# Patient Record
Sex: Male | Born: 1985
Health system: Southern US, Community
[De-identification: ages and names within clinical notes are randomized; demographics above are authoritative.]

---
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
## 2006-11-07 ENCOUNTER — Emergency Department (HOSPITAL_COMMUNITY): Admission: EM | Admit: 2006-11-07 | Discharge: 2006-11-07 | Payer: Self-pay | Admitting: Emergency Medicine

## 2008-10-09 ENCOUNTER — Emergency Department (HOSPITAL_COMMUNITY): Admission: EM | Admit: 2008-10-09 | Discharge: 2008-10-09 | Payer: Self-pay | Admitting: Emergency Medicine

## 2009-09-09 ENCOUNTER — Emergency Department (HOSPITAL_COMMUNITY): Admission: EM | Admit: 2009-09-09 | Discharge: 2009-09-10 | Payer: Self-pay | Admitting: Emergency Medicine

## 2010-07-13 NOTE — Op Note (Signed)
   NAME:  Carl Shepard, Carl Shepard                        ACCOUNT NO.:  [id number]   MEDICAL RECORD NO.:  [id number]                   PATIENT TYPE:  AMB   LOCATION:  DAY                                  FACILITY:  [facility]   PHYSICIAN:  Ronald Shepard. Darrelyn Hillock, M.D.             DATE OF BIRTH:  1985-03-12   DATE OF PROCEDURE:  10/14/2002  DATE OF DISCHARGE:                                 OPERATIVE REPORT   SURGEON:  Georges Lynch. Darrelyn Hillock, M.D.   ASSISTANT:  Nurse.   PREOPERATIVE DIAGNOSIS:  Bucket-handle tear of the posterior horn of the  medial meniscus, right knee, severe.   POSTOPERATIVE DIAGNOSIS:  Bucket-handle tear of the posterior horn of the  medial meniscus, right knee, severe.   OPERATION:  1. Diagnostic arthroscopy, right knee.  2. Medial meniscectomy posterior horn medial meniscus, right knee.   DESCRIPTION OF PROCEDURE:  Under general anesthesia, Shepard routine orthopedic  prep draping of the right knee was carried out.  At this time, three small  punctate incisions were made, one in the suprapatellar area, one in the  lateral joint, one in the medial joint.  I then inserted the cannula and  inflated the knee with fluid in the usual fashion.  I then entered the  arthroscope in lateral approach, did Shepard complete diagnostic arthroscopy.  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  I introduced Shepard shaver suction device and did Shepard partial  medial meniscectomy, thoroughly irrigated out the area, and then removed all  the fluid, closed all three punctate incisions with 3-0 nylon suture.  I  injected 20 mL of 0.5% Marcaine with epinephrine into the knee joint, and Shepard  sterile Neosporin dressing was applied.  The patient left the operating room  in satisfactory condition, postop will be on Bufferin 1 b.i.d. as an  anticoagulant for two weeks.  He will be on Percocet  10/650 for pain and  Robaxin 500 mg t.i.d. for muscle relaxation.  He will be on crutches, full  weightbearing.  I will see him in 12-14 days or prior to that if there is Shepard  problem.                                               Ronald Shepard. Darrelyn Hillock, M.D.    RAG/MEDQ  D:  10/14/2002  T:  10/14/2002  Job:  [id number]

## 2010-07-13 NOTE — Op Note (Signed)
NAME:  Carl Shepard, Carl Shepard                        ACCOUNT NO.:  [id number]   MEDICAL RECORD NO.:  [id number]                   PATIENT TYPE:  OIB   LOCATION:  2856                                 FACILITY:  [facility]   PHYSICIAN:  Dionne Ano. Everlene Other, M.D.         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   POSTOPERATIVE DIAGNOSIS:  Chronic right small finger dislocation secondary  to an injury in November of 2005, now presenting with chronic dislocation,  loss of motion, arthrofibrosis, and volar plate disruption.   PROCEDURE:  1. Open relocation right small finger, chronic dislocation about the     proximal interphalangeal joint.  2. Right small finger volar plate reconstruction.  3. Right small finger capsulotomy, extensor tenolysis, and collateral     ligament release about the accessory and proper collateral ligaments.  4. Manipulation under anesthesia right small finger.  5. Stress radiography right small finger and hand.  6. External fixation with 0.045 K-wire right small finger.   SURGEON:  Dionne Ano. Amanda Pea, M.D.   ASSISTANT:  Karie Chimera, P.Shepard.-C.   COMPLICATIONS:  None.   ANESTHESIA:  General.   TOURNIQUET TIME:  Less than an hour.   INDICATIONS FOR PROCEDURE:  The patient is an 25 year old male who presents  with the above mentioned diagnoses.  After counseling regarding the risks  and benefits of surgery including risks of infection, bleeding, anesthesia,  damage to normal structures, and failure of the surgery to  accomplish___________.  I have discussed with him high propensity toward  degenerative change, possible inability to relocate the finger and  need for  fusion in the future, and possible adverse outcome including neurovascular  injury.  With all issues in mind, he desires to proceed.  All questions have  been encouraged and answered preoperatively.   DESCRIPTION OF PROCEDURE:  The patient was seen by myself in anesthesia.  He  was taken to the operating room and underwent smooth induction of general  anesthesia.  Once this was done, he was placed supine, appropriately padded,  and prepped and draped in the usual sterile fashion about the right upper  extremity.  Once the sterile field was secured, body parts were  appropriately padded and preoperative Ancef was given.  The patient then  underwent Shepard manipulation under anesthesia revealing the chronic dislocation  without any tendency toward relocation successfully.  This was due to the  chronic state of affairs about the finger and the interposed volar plate and  disruption.  I have discussed these issues with him preoperatively and he  understood the likely need for open reduction attempts, etc.  So did his  mother.  The operation commenced with Shepard Chevron approach volarly to the right small  finger and Shepard 250 mm tourniquet control.  I incised the skin.  Following  this, the radial and ulnar neurovascular bundles were protected and swept  out of harm's way.  I identified the A3 pulley and then incised this.  The  A3 pulley was then lifted in an ulnar to radial direction exposing the FEP  and FES tendons.  I then gently mobilized these tendons, placed Shepard Penrose  drain around this, and once this was done, performed Shepard capsulotomy as well  as an extensor tenolysis and collateral ligament release with the [facility]  blade.  The accessory and proper collateral ligaments were released and  extensor tenolysis was performed with Shepard combination of Therapist, nutritional, Theme park manager, and general manipulation.  Shepard beaver blade was used to  remove the volar plate and interposed capsule  from the joint.  Remnants were  saved.  I cleaned the joint out meticulously.  Following this, the patient  had the ability to perform an open relocation of the small finger.  This was  relocated with general stressing and following this, I manipulated the right  small finger under anesthesia.  The patient was manipulated to break up the  last bit of dorsal adhesions and was able to be brought into full flexion.  I was pleased with the flexion and extension.  The patient could be extended  to 15 degrees successfully.  Passed 10 degrees, he had Shepard slight dislocating  feature.  Given the release that was required to relocate him.  Thus, at  this point in time, he underwent open relocation and Shepard right small finger  capsulotomy with extensor tenolysis and collateral ligament release.  The  patient also underwent the manipulation under anesthesia of the right small  finger, taking the small finger to the extremes of flexion and then beyond  somewhat under fluoroscopy providing the ability to see the joint reduced  and without complicating features.  Once this was done, I then performed Shepard  stress radiography revealing the measurements previously outlined and  knowing that the patient had Shepard slight dislocation tendency at the last 10  degrees, I then placed external fixation in the form of Shepard 0.045 K-wire about  the proximal phalanx.  This was driven with Shepard wire driver into the head of  the proximal phalanx to provide Shepard stop to the last 15 degrees of flexion and  thus we can initiate early active flexion of the finger through the FEP and  MPS tendons.  At this point in time, I then performed Shepard tenodesis effect of  the FEP and FDS tendons revealing Shepard full flexion of the finger.  Full  flexion was noted and all looked quite well.   Thus, the patient underwent open relocation of his finger with Shepard  capsulotomy, extensor tenolysis, as well as manipulation under anesthesia, and external fixation.  Once  this was done, I then repaired the volar plate  with Ethibond suture.  The volar plate was reapproximated, but not in Shepard  tight fashion.  Once this was done, we then repaired the A3 pulley with  Ethibond suture of the 4-0 variety.  Following this, the tourniquet was  deflated, Marcaine was placed in the wound for postoperative analgesia, and  the patient then underwent copious irrigation as we performed at multiple  points during the procedure.  Following this, the wound was closed with  Prolene.  The patient had the pin protected with Xeroform and Shepard pin cap and  following this, the patient underwent application of Shepard dual splint.  He  tolerated the procedure well.  There were no complicating features with his  surgery.  We will initiate early range of motion Monday in our office.  We  will discharge him on Keflex, Percocet, and Robaxin.  I have asked him to  notify me with any problems, questions, or concerns.  Otherwise, I will see  him back in the office on Monday to begin early range of motion.  All  questions have been encouraged and answered.  There were no complicating  features with the orthopedic portion of this procedure.                                               Dionne Ano. Everlene Other, M.D.    Nash Mantis  D:  02/25/2003  T:  02/25/2003  Job:  [id number]

## 2010-12-07 LAB — ETHANOL: Alcohol, Ethyl (B): 184 — ABNORMAL HIGH

## 2011-12-29 ENCOUNTER — Emergency Department (HOSPITAL_COMMUNITY)
Admission: EM | Admit: 2011-12-29 | Discharge: 2011-12-29 | Disposition: A | Discharge status: Home / Self Care | Payer: Self-pay | Attending: Emergency Medicine | Admitting: Emergency Medicine

## 2011-12-29 ENCOUNTER — Encounter (HOSPITAL_COMMUNITY): Payer: Self-pay | Admitting: Emergency Medicine

## 2011-12-29 DIAGNOSIS — F17200 Nicotine dependence, unspecified, uncomplicated: Secondary | ICD-10-CM | POA: Insufficient documentation

## 2011-12-29 DIAGNOSIS — M549 Dorsalgia, unspecified: Secondary | ICD-10-CM | POA: Insufficient documentation

## 2011-12-29 MED ORDER — HYDROCODONE-ACETAMINOPHEN 5-325 MG PO TABS: 1.0000 | ORAL_TABLET | ORAL | Status: DC | PRN

## 2011-12-29 MED ORDER — CYCLOBENZAPRINE HCL 10 MG PO TABS: 10.0000 mg | ORAL_TABLET | Freq: Three times a day (TID) | ORAL | Status: DC | PRN

## 2011-12-29 MED ORDER — NAPROXEN 500 MG PO TABS: 500.0000 mg | ORAL_TABLET | Freq: Two times a day (BID) | ORAL | Status: DC

## 2011-12-29 NOTE — ED Provider Notes (Signed)
History     CSN: [id number]  Arrival date & time 12/29/11  1056   First MD Initiated Contact with Patient 12/29/11 1203      Chief Complaint  Patient presents with  . Back Pain    (Consider location/radiation/quality/duration/timing/severity/associated sxs/prior treatment) Patient is a 26 y.o. male presenting with back pain. The history is provided by the patient.  Back Pain  This is a recurrent problem. The current episode started more than 1 week ago. The problem occurs constantly. The problem has not changed since onset.The pain is associated with no known injury. Pertinent negatives include no fever, no numbness, no abdominal pain, no bowel incontinence and no bladder incontinence. Associated symptoms comments: Pain in the right lower back that is the same pain he has had in the past. It radiates into the right leg posteriorly. No numbness, tingling or weakness. .    No past medical history on file.  No past surgical history on file.  No family history on file.  History  Substance Use Topics  . Smoking status: Current Every Day Smoker -- 0.5 packs/day    Types: Cigarettes  . Smokeless tobacco: Not on file  . Alcohol Use: No      Review of Systems  Constitutional: Negative for fever.  HENT: Negative for neck pain.   Gastrointestinal: Negative for abdominal pain and bowel incontinence.  Genitourinary: Negative for bladder incontinence and difficulty urinating.  Musculoskeletal: Positive for back pain.  Neurological: Negative for numbness.    Allergies  Review of patient's allergies indicates no known allergies.  Home Medications   Current Outpatient Rx  Name  Route  Sig  Dispense  Refill  . TIGER BALM EXTRA STRENGTH 11-10 % EX OINT   Apply externally   Apply 1 application topically daily as needed. Apply to painful areas on lower back for pain         . IBUPROFEN 200 MG PO TABS   Oral   Take 200 mg by mouth every 6 (six) hours as needed. For pain           . ADULT MULTIVITAMIN W/MINERALS CH   Oral   Take 1 tablet by mouth daily.           BP 132/79  Pulse 96  Temp 97.7 F (36.5 C) (Oral)  Resp 18  SpO2 100%  Physical Exam  Constitutional: He is oriented to person, place, and time. He appears well-developed and well-nourished. No distress.  Neck: Normal range of motion.  Pulmonary/Chest: Effort normal.  Abdominal: Soft. He exhibits no mass. There is no tenderness.  Musculoskeletal: Normal range of motion.       Right paralumbar tenderness without swelling, discoloration. No sciatic tenderness on right. Distal pulses 2+.  Neurological: He is alert and oriented to person, place, and time. He has normal reflexes. No sensory deficit.  Skin: Skin is warm and dry.  Psychiatric: He has a normal mood and affect.    ED Course  Procedures (including critical care time)  Labs Reviewed - No data to display No results found.   No diagnosis found.  1. Muscular back pain  MDM  Recurrent pain that is better with rest and worse with movement. No neurologic deficits.         Rodena Medin, PA-C 12/29/11 1218

## 2011-12-29 NOTE — ED Notes (Signed)
Pt states that he has had low back pain for a week.  States he has tried to stretch it out, hot bath, ice, tigerbomb, naproxen, and ibuprofen with no relief.

## 2011-12-30 NOTE — ED Provider Notes (Signed)
Medical screening examination/treatment/procedure(s) were performed by non-physician practitioner and as supervising physician I was immediately available for consultation/collaboration.   Suzi Roots, MD 12/30/11 [phone]

## 2015-06-06 ENCOUNTER — Telehealth: Payer: Self-pay | Admitting: *Deleted

## 2015-06-15 NOTE — Telephone Encounter (Signed)
Problems resolved.

## 2015-11-13 ENCOUNTER — Emergency Department (HOSPITAL_COMMUNITY)
Admission: EM | Admit: 2015-11-13 | Discharge: 2015-11-13 | Disposition: A | Discharge status: Home / Self Care | Payer: Self-pay | Attending: Emergency Medicine | Admitting: Emergency Medicine

## 2015-11-13 ENCOUNTER — Encounter (HOSPITAL_COMMUNITY): Payer: Self-pay | Admitting: Emergency Medicine

## 2015-11-13 DIAGNOSIS — H7292 Unspecified perforation of tympanic membrane, left ear: Secondary | ICD-10-CM

## 2015-11-13 DIAGNOSIS — Z87891 Personal history of nicotine dependence: Secondary | ICD-10-CM | POA: Insufficient documentation

## 2015-11-13 MED ORDER — CIPROFLOXACIN-DEXAMETHASONE 0.3-0.1 % OT SUSP: 4.0000 [drp] | Freq: Two times a day (BID) | OTIC | 0 refills | Status: AC

## 2015-11-13 MED FILL — CIPRODEX OTIC SUSPENSION: 0.3-0.1 | 25 days supply | Qty: 8 | Fill #0
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
## 2015-11-13 NOTE — ED Notes (Signed)
Bed: WA21 Expected date:  Expected time:  Means of arrival:  Comments: 

## 2015-11-13 NOTE — Discharge Instructions (Signed)
Use eardrops as prescribed. Avoid placing anything else in your ear dunking your head under water. Follow up with ENT if your symptoms do not improve. Return to the ED if you experience severe worsening of your symptoms, fevers, chills, decreased hearing, worsening pain, drainage from the ear.

## 2015-11-13 NOTE — ED Notes (Signed)
Pt aware of HTN.  Has no insurance for another 30days.  Has plan to see PCP then.  RN counseled on low salt diet and exercise.  [facility] and gave contact info.

## 2017-01-20 ENCOUNTER — Ambulatory Visit (HOSPITAL_COMMUNITY)
Admission: EM | Admit: 2017-01-20 | Discharge: 2017-01-20 | Disposition: A | Discharge status: Home / Self Care | Payer: BLUE CROSS/BLUE SHIELD | Attending: Physician Assistant | Admitting: Physician Assistant

## 2017-01-20 ENCOUNTER — Encounter (HOSPITAL_COMMUNITY): Payer: Self-pay | Admitting: Emergency Medicine

## 2017-01-20 DIAGNOSIS — M5441 Lumbago with sciatica, right side: Secondary | ICD-10-CM | POA: Diagnosis not present

## 2017-01-20 MED ORDER — HYDROCODONE-ACETAMINOPHEN 5-325 MG PO TABS: 1.0000 | ORAL_TABLET | ORAL | 0 refills | Status: DC | PRN

## 2017-01-20 MED ORDER — METHOCARBAMOL 500 MG PO TABS: 500.0000 mg | ORAL_TABLET | Freq: Four times a day (QID) | ORAL | 0 refills | Status: DC | PRN

## 2017-01-20 MED ORDER — PREDNISONE 10 MG (21) PO TBPK: ORAL_TABLET | Freq: Every day | ORAL | 0 refills | Status: DC

## 2017-01-20 NOTE — ED Provider Notes (Signed)
[facility]    CSN: [id number] Arrival date & time: 01/20/17  1208     History   Chief Complaint Chief Complaint  Patient presents with  . Back Pain    HPI Erlene QuanJeffrey A Picco is a 31 y.o. male.   31 year-old male, presenting today complaining of back pain. He states that he helped lift a 300 lb tv on Saturday. Since that time, he has had right sided back pain with right sided sciatica  He denies loss of bowel or bladder function, saddle anesthesia, numbness or weakness in the lower extremities     The history is provided by the patient.  Back Pain  Location:  Lumbar spine Quality:  Aching Radiates to:  R posterior upper leg Pain severity:  Moderate Pain is:  Same all the time Onset quality:  Gradual Duration:  3 days Timing:  Constant Progression:  Unchanged Chronicity:  Recurrent Context: lifting heavy objects   Context: not emotional stress, not falling, not jumping from heights, not MCA, not MVA, not occupational injury and not pedestrian accident   Relieved by:  Nothing Worsened by:  Ambulation and twisting Ineffective treatments:  None tried Associated symptoms: leg pain   Associated symptoms: no abdominal pain, no abdominal swelling, no bladder incontinence, no bowel incontinence, no chest pain, no dysuria, no fever, no headaches, no numbness, no paresthesias, no pelvic pain, no perianal numbness, no tingling, no weakness and no weight loss   Risk factors: no hx of cancer, no hx of osteoporosis, no lack of exercise, no menopause, not obese and not pregnant     History reviewed. No pertinent past medical history.  There are no active problems to display for this patient.   History reviewed. No pertinent surgical history.     Home Medications    Prior to Admission medications   Medication Sig Start Date End Date Taking? Authorizing Provider  ciprofloxacin-dexamethasone (CIPRODEX) otic suspension Place 4 drops into the left ear 2 (two) times  daily. Take for 10 days 11/13/15   Dowless, Lester KinsmanSamantha Tripp, PA-C  HYDROcodone-acetaminophen (NORCO/VICODIN) 5-325 MG tablet Take 1 tablet by mouth every 4 (four) hours as needed. 01/20/17   Diva Lemberger C, PA-C  methocarbamol (ROBAXIN) 500 MG tablet Take 1 tablet (500 mg total) by mouth every 6 (six) hours as needed for muscle spasms. 01/20/17   Deionna Marcantonio C, PA-C  predniSONE (STERAPRED UNI-PAK 21 TAB) 10 MG (21) TBPK tablet Take by mouth daily. Take 6 tabs by mouth daily  for 2 days, then 5 tabs for 2 days, then 4 tabs for 2 days, then 3 tabs for 2 days, 2 tabs for 2 days, then 1 tab by mouth daily for 2 days 01/20/17   Alecia LemmingBlue, Alletta Mattos C, PA-C    Family History History reviewed. No pertinent family history.  Social History Social History   Tobacco Use  . Smoking status: Former Smoker    Packs/day: 0.50    Types: Cigarettes  . Smokeless tobacco: Never Used  Substance Use Topics  . Alcohol use: Yes  . Drug use: No     Allergies   Patient has no known allergies.   Review of Systems Review of Systems  Constitutional: Negative for chills, fever and weight loss.  HENT: Negative for ear pain and sore throat.   Eyes: Negative for pain and visual disturbance.  Respiratory: Negative for cough and shortness of breath.   Cardiovascular: Negative for chest pain and palpitations.  Gastrointestinal: Negative for abdominal pain, bowel  incontinence and vomiting.  Genitourinary: Negative for bladder incontinence, dysuria, hematuria and pelvic pain.  Musculoskeletal: Positive for back pain. Negative for arthralgias.  Skin: Negative for color change and rash.  Neurological: Negative for tingling, seizures, syncope, weakness, numbness, headaches and paresthesias.  All other systems reviewed and are negative.    Physical Exam Triage Vital Signs ED Triage Vitals  Enc Vitals Group     BP 01/20/17 1222 (!) 187/120     Pulse Rate 01/20/17 1222 99     Resp 01/20/17 1222 18     Temp 01/20/17  1222 98.8 F (37.1 C)     Temp Source 01/20/17 1222 Oral     SpO2 01/20/17 1222 100 %     Weight --      Height --      Head Circumference --      Peak Flow --      Pain Score 01/20/17 1223 8     Pain Loc --      Pain Edu? --      Excl. in GC? --    No data found.  Updated Vital Signs BP (!) 187/120   Pulse 99   Temp 98.8 F (37.1 C) (Oral)   Resp 18   SpO2 100%   Visual Acuity Right Eye Distance:   Left Eye Distance:   Bilateral Distance:    Right Eye Near:   Left Eye Near:    Bilateral Near:     Physical Exam  Constitutional: He is oriented to person, place, and time. He appears well-developed and well-nourished.  HENT:  Head: Normocephalic and atraumatic.  Eyes: Conjunctivae are normal.  Neck: Neck supple.  Cardiovascular: Normal rate and regular rhythm.  No murmur heard. Pulmonary/Chest: Effort normal and breath sounds normal. No respiratory distress.  Abdominal: Soft. There is no tenderness.  Musculoskeletal: He exhibits no edema.       Lumbar back: He exhibits tenderness.       Back:  Tenderness of the right sided lumbar region No midline tenderness    Neurological: He is alert and oriented to person, place, and time. He has normal strength and normal reflexes. No cranial nerve deficit or sensory deficit. He displays a negative Romberg sign. GCS eye subscore is 4. GCS verbal subscore is 5. GCS motor subscore is 6.  Speech clear and fluent Cranial nerves 2-12 intact No facial droop, nystagmus Normal strength and sensation of the upper extremities bilaterally Normal strength and sensation of the lower extremities bilaterally.  No tremor or seizure activity noted Normal gait   Skin: Skin is warm and dry.  Psychiatric: He has a normal mood and affect.  Nursing note and vitals reviewed.    UC Treatments / Results  Labs (all labs ordered are listed, but only abnormal results are displayed) Labs Reviewed - No data to display  EKG  EKG  Interpretation None       Radiology No results found.  Procedures Procedures (including critical care time)  Medications Ordered in UC Medications - No data to display   Initial Impression / Assessment and Plan / UC Course  I have reviewed the triage vital signs and the nursing notes.  Pertinent labs & imaging results that were available during my care of the patient were reviewed by me and considered in my medical decision making (see chart for details).     Right sided back pain with right sided sciatica after heavy lifting Normal neuro exam No red flag symptoms Pain  meds, steroids, muscle relaxants  Ice    Final Clinical Impressions(s) / UC Diagnoses   Final diagnoses:  Acute right-sided low back pain with right-sided sciatica    ED Discharge Orders        Ordered    HYDROcodone-acetaminophen (NORCO/VICODIN) 5-325 MG tablet  Every 4 hours PRN     01/20/17 1256    predniSONE (STERAPRED UNI-PAK 21 TAB) 10 MG (21) TBPK tablet  Daily     01/20/17 1256    methocarbamol (ROBAXIN) 500 MG tablet  Every 6 hours PRN     01/20/17 1256       Controlled Substance Prescriptions [place] Controlled Substance Registry consulted? Yes, I have consulted the [place] Controlled Substances Registry for this patient, and feel the risk/benefit ratio today is favorable for proceeding with this prescription for a controlled substance.   Alecia Lemming, [place] 01/20/17 1304

## 2017-01-20 NOTE — ED Triage Notes (Signed)
Pt sts right sided lower back pain with radiation to leg x 3 days

## 2017-12-12 ENCOUNTER — Encounter (HOSPITAL_COMMUNITY): Payer: Self-pay | Admitting: Emergency Medicine

## 2017-12-12 ENCOUNTER — Emergency Department (HOSPITAL_COMMUNITY)
Admission: EM | Admit: 2017-12-12 | Discharge: 2017-12-12 | Disposition: A | Discharge status: Home / Self Care | Payer: BLUE CROSS/BLUE SHIELD | Attending: Emergency Medicine | Admitting: Emergency Medicine

## 2017-12-12 DIAGNOSIS — M5442 Lumbago with sciatica, left side: Secondary | ICD-10-CM | POA: Insufficient documentation

## 2017-12-12 DIAGNOSIS — Z87891 Personal history of nicotine dependence: Secondary | ICD-10-CM | POA: Insufficient documentation

## 2017-12-12 MED ORDER — NAPROXEN 500 MG PO TABS: 500.0000 mg | ORAL_TABLET | Freq: Two times a day (BID) | ORAL | 0 refills | Status: AC

## 2017-12-12 MED ORDER — PREDNISONE 10 MG (21) PO TBPK: ORAL_TABLET | Freq: Every day | ORAL | 0 refills | Status: AC

## 2017-12-12 MED ORDER — HYDROCODONE-ACETAMINOPHEN 5-325 MG PO TABS: 1.0000 | ORAL_TABLET | Freq: Four times a day (QID) | ORAL | 0 refills | Status: AC | PRN

## 2017-12-12 MED ORDER — KETOROLAC TROMETHAMINE 30 MG/ML IJ SOLN
30.0000 mg | Freq: Once | INTRAMUSCULAR | Status: AC
  Administered 2017-12-12: 30 mg via INTRAMUSCULAR
  Filled 2017-12-12: qty 1

## 2017-12-12 MED ORDER — METHOCARBAMOL 500 MG PO TABS: 500.0000 mg | ORAL_TABLET | Freq: Two times a day (BID) | ORAL | 0 refills | Status: AC

## 2017-12-12 MED ORDER — PREDNISONE 20 MG PO TABS
60.0000 mg | ORAL_TABLET | Freq: Once | ORAL | Status: AC
  Administered 2017-12-12: 60 mg via ORAL
  Filled 2017-12-12: qty 3

## 2017-12-12 MED ORDER — LIDOCAINE 5 % EX PTCH
1.0000 | MEDICATED_PATCH | CUTANEOUS | Status: DC
  Administered 2017-12-12: 1 via TRANSDERMAL
  Filled 2017-12-12: qty 1

## 2017-12-12 NOTE — ED Triage Notes (Signed)
Pt reports that he worked a double shift last night and having lower back pain that radiates down to left hip and left leg that is worse with movement. Denies fall and injuries.

## 2017-12-12 NOTE — ED Provider Notes (Signed)
[facility]-EMERGENCY DEPT Provider Note   CSN: [id number] Arrival date & time: 12/12/17  1254     History   Chief Complaint Chief Complaint  Patient presents with  . Back Pain    HPI Carl Shepard is a 32 y.o. male.  Carl Shepard is a 32 y.o. Male who is otherwise healthy, presents to the emergency department for evaluation of left-sided lower back pain that radiates into his left hip and left leg.  Pain is worse with movement and palpation.  He denies any falls or trauma to the area but does report that he does a lot of strenuous work and heavy lifting at work only worked to double shifts.  He reports history of similar symptoms in the past related to heavy lifting at his job and he has had sciatica which she has had to have treated with steroids and muscle relaxers in the past.  He tried doing stretches at home last night that he has been provided before but reports he could just feel his back tightening up more and more the stretches became increasingly painful.  No loss of bowel or bladder control and no saddle anesthesia, no weakness numbness or tingling in his lower extremities, no associated abdominal pain, no urinary symptoms, no fevers or chills, no history of cancer or IV drug use.  Took some ibuprofen last night and this morning with minimal improvement has not tried anything else.     History reviewed. No pertinent past medical history.  There are no active problems to display for this patient.   History reviewed. No pertinent surgical history.      Home Medications    Prior to Admission medications   Medication Sig Start Date End Date Taking? Authorizing Provider  ciprofloxacin-dexamethasone (CIPRODEX) otic suspension Place 4 drops into the left ear 2 (two) times daily. Take for 10 days 11/13/15   Dowless, Lester Kinsman, PA-C  HYDROcodone-acetaminophen (NORCO/VICODIN) 5-325 MG tablet Take 1 tablet by mouth every 6 (six) hours as needed.  12/12/17   Dartha Lodge, PA-C  methocarbamol (ROBAXIN) 500 MG tablet Take 1 tablet (500 mg total) by mouth 2 (two) times daily. 12/12/17   Dartha Lodge, PA-C  naproxen (NAPROSYN) 500 MG tablet Take 1 tablet (500 mg total) by mouth 2 (two) times daily. 12/12/17   Dartha Lodge, PA-C  predniSONE (STERAPRED UNI-PAK 21 TAB) 10 MG (21) TBPK tablet Take by mouth daily. Take 6 tabs by mouth daily for 2 days, then 5 for 2 days, 4 for 2 days, 3 for 2 days, 2 for 2 days, 1 for 2 days 12/12/17   Dartha Lodge, PA-C    Family History No family history on file.  Social History Social History   Tobacco Use  . Smoking status: Former Smoker    Packs/day: 0.50    Types: Cigarettes  . Smokeless tobacco: Never Used  Substance Use Topics  . Alcohol use: Yes  . Drug use: No     Allergies   Patient has no known allergies.   Review of Systems Review of Systems  Constitutional: Negative for chills and fever.  HENT: Negative.   Respiratory: Negative for shortness of breath.   Cardiovascular: Negative for chest pain.  Gastrointestinal: Negative for abdominal pain, constipation, diarrhea, nausea and vomiting.  Genitourinary: Negative for dysuria, flank pain, frequency and hematuria.  Musculoskeletal: Positive for back pain. Negative for arthralgias, gait problem, joint swelling, myalgias and neck pain.  Skin: Negative for  color change, rash and wound.  Neurological: Negative for weakness and numbness.     Physical Exam Updated Vital Signs BP 139/90 (BP Location: Left Arm)   Pulse 92   Temp 97.9 F (36.6 C) (Oral)   Resp 17   SpO2 100%   Physical Exam  Constitutional: He is oriented to person, place, and time. He appears well-developed and well-nourished. No distress.  HENT:  Head: Atraumatic.  Eyes: Right eye exhibits no discharge. Left eye exhibits no discharge.  Neck: Neck supple.  Cardiovascular:  Pulses:      Radial pulses are 2+ on the right side, and 2+ on the left side.        Dorsalis pedis pulses are 2+ on the right side, and 2+ on the left side.       Posterior tibial pulses are 2+ on the right side, and 2+ on the left side.  Pulmonary/Chest: Effort normal. No respiratory distress.  Abdominal: Soft. Bowel sounds are normal. He exhibits no distension and no mass. There is no tenderness. There is no guarding.  Abdomen soft, nondistended, nontender to palpation in all quadrants without guarding or peritoneal signs, no CVA tenderness bilaterally  Musculoskeletal:  Tenderness to palpation over left lower back, no midline spinal tenderness.  Pain made worse with range of motion of the lower extremities, positive straight leg raise on the left.  Neurological: He is alert and oriented to person, place, and time.  Alert, clear speech, following commands. Moving all extremities without difficulty. Bilateral lower extremities with 5/5 strength in proximal and distal muscle groups and with dorsi and plantar flexion. Sensation intact in bilateral lower extremities. 2+ patellar DTRs bilaterally. Ambulatory with steady gait  Skin: Skin is warm and dry. Capillary refill takes less than 2 seconds. He is not diaphoretic.  Psychiatric: He has a normal mood and affect. His behavior is normal.  Nursing note and vitals reviewed.    ED Treatments / Results  Labs (all labs ordered are listed, but only abnormal results are displayed) Labs Reviewed - No data to display  EKG None  Radiology No results found.  Procedures Procedures (including critical care time)  Medications Ordered in ED Medications  lidocaine (LIDODERM) 5 % 1 patch (1 patch Transdermal Patch Applied 12/12/17 1326)  predniSONE (DELTASONE) tablet 60 mg (60 mg Oral Given 12/12/17 1321)  ketorolac (TORADOL) 30 MG/ML injection 30 mg (30 mg Intramuscular Given 12/12/17 1322)     Initial Impression / Assessment and Plan / ED Course  I have reviewed the triage vital signs and the nursing  notes.  Pertinent labs & imaging results that were available during my care of the patient were reviewed by me and considered in my medical decision making (see chart for details).  Normal neurological exam, no evidence of urinary incontinence or retention, pain is consistently reproducible. There is no evidence of AAA or concern for dissection at this time.   Patient can walk but states is painful.  No loss of bowel or bladder control.  No concern for cauda equina.  No fever, night sweats, weight loss, h/o cancer, IVDU.  Pain treated here in the department with adequate improvement. RICE protocol and pain medicine indicated and discussed with patient. I have also discussed reasons to return immediately to the ER.  Patient expresses understanding and agrees with plan.  Final Clinical Impressions(s) / ED Diagnoses   Final diagnoses:  Acute left-sided low back pain with left-sided sciatica    ED Discharge Orders  Ordered    methocarbamol (ROBAXIN) 500 MG tablet  2 times daily     12/12/17 1325    predniSONE (STERAPRED UNI-PAK 21 TAB) 10 MG (21) TBPK tablet  Daily     12/12/17 1325    naproxen (NAPROSYN) 500 MG tablet  2 times daily     12/12/17 1325    HYDROcodone-acetaminophen (NORCO/VICODIN) 5-325 MG tablet  Every 6 hours PRN     12/12/17 1325           Dartha Lodge, PA-C 12/12/17 1340    Arby Barrette, MD 12/19/17 [phone]

## 2017-12-12 NOTE — ED Notes (Signed)
Patient verbalized understanding of discharge instructions, no questions. Patient ambulated out of ED with steady gait in no distress.  

## 2017-12-12 NOTE — Discharge Instructions (Addendum)
You were seen here today for Back Pain: Low back pain is discomfort in the lower back that may be due to injuries to muscles and ligaments around the spine. Occasionally, it may be caused by a problem to a part of the spine called a disc. Your back pain should be treated with medicines listed below as well as back exercises and this back pain should get better over the next 2 weeks. Most patients get completely well in 4 weeks. It is important to know however, if you develop severe or worsening pain, low back pain with fever, numbness, weakness or inability to walk or urinate, you should return to the ER immediately.  Please follow up with your doctor this week for a recheck if still having symptoms.  HOME INSTRUCTIONS Self - care:  The application of heat can help soothe the pain.  Maintaining your daily activities, including walking (this is encouraged), as it will help you get better faster than just staying in bed. Do not life, push, pull anything more than 10 pounds for the next week. I am attaching back exercises that you can do at home to help facilitate your recovery.   Back Exercises - I have attached a handout on back exercises that can be done at home to help facilitate your recovery.   Medications are also useful to help with pain control.   Acetaminophen.  This medication is generally safe, and found over the counter. Take as directed for your age. You should not take more than 8 of the extra strength (500mg ) pills a day (max dose is 4000mg  total OVER one day)  Non steroidal anti inflammatory: This includes medications including Ibuprofen, naproxen and Mobic; These medications help both pain and swelling and are very useful in treating back pain.  They should be taken with food, as they can cause stomach upset, and more seriously, stomach bleeding. Do not combine the medications.  Muscle relaxants:  These medications can help with muscle tightness that is a cause of lower back pain.  Most  of these medications can cause drowsiness, and it is not safe to drive or use dangerous machinery while taking them. They are primarily helpful when taken at night before sleep.  Prednisone - This is an oral steroid.  This medication is best taken with food in the morning.  Please note that this medication can cause anxiety, mood swings, muscle fatigue, increased hunger, weight gain (sodium/fluid retention), poor sleep as well as other symptoms. If you are a diabetic, please monitor your blood sugars at home as this medication can increase your blood sugars. Call your pharmacist if you have any questions.  You will need to follow up with your primary healthcare provider in 1-2 weeks for reassessment and persistent symptoms.  Be aware that if you develop new symptoms, such as a fever, leg weakness, difficulty with or loss of control of your urine or bowels, abdominal pain, or more severe pain, you will need to seek medical attention and/or return to the Emergency department. Additional Information:  Your vital signs today were: BP (!) 155/99 (BP Location: Right Arm)    Pulse 99    Temp (!) 97.5 F (36.4 C) (Oral)    Resp 18    SpO2 100%  If your blood pressure (BP) was elevated above 135/85 this visit, please have this repeated by your doctor within one month. ---------------

## 2019-02-20 ENCOUNTER — Emergency Department (HOSPITAL_COMMUNITY)
Admission: EM | Admit: 2019-02-20 | Discharge: 2019-02-20 | Disposition: A | Discharge status: Home / Self Care | Payer: Self-pay | Attending: Emergency Medicine | Admitting: Emergency Medicine

## 2019-02-20 ENCOUNTER — Other Ambulatory Visit: Payer: Self-pay

## 2019-02-20 ENCOUNTER — Emergency Department (HOSPITAL_COMMUNITY): Payer: Self-pay

## 2019-02-20 ENCOUNTER — Encounter (HOSPITAL_COMMUNITY): Payer: Self-pay

## 2019-02-20 DIAGNOSIS — S0091XA Abrasion of unspecified part of head, initial encounter: Secondary | ICD-10-CM | POA: Insufficient documentation

## 2019-02-20 DIAGNOSIS — S0990XA Unspecified injury of head, initial encounter: Secondary | ICD-10-CM

## 2019-02-20 DIAGNOSIS — Y929 Unspecified place or not applicable: Secondary | ICD-10-CM | POA: Insufficient documentation

## 2019-02-20 DIAGNOSIS — Z79899 Other long term (current) drug therapy: Secondary | ICD-10-CM | POA: Insufficient documentation

## 2019-02-20 DIAGNOSIS — Y939 Activity, unspecified: Secondary | ICD-10-CM | POA: Insufficient documentation

## 2019-02-20 DIAGNOSIS — Z87891 Personal history of nicotine dependence: Secondary | ICD-10-CM | POA: Insufficient documentation

## 2019-02-20 DIAGNOSIS — G44319 Acute post-traumatic headache, not intractable: Secondary | ICD-10-CM | POA: Insufficient documentation

## 2019-02-20 DIAGNOSIS — Y999 Unspecified external cause status: Secondary | ICD-10-CM | POA: Insufficient documentation

## 2019-02-20 DIAGNOSIS — W2209XA Striking against other stationary object, initial encounter: Secondary | ICD-10-CM | POA: Insufficient documentation

## 2019-02-20 MED ORDER — ONDANSETRON 4 MG PO TBDP: 4.0000 mg | ORAL_TABLET | Freq: Three times a day (TID) | ORAL | 0 refills | Status: AC | PRN

## 2019-02-20 NOTE — Discharge Instructions (Addendum)
You were seen in the ER after a head injury with headache, nausea, fatigue.    Head CT is normal.  Your symptoms are most likely due to a mild concussion or post traumatic headache. Read attached information on concussion.   Post traumatic headache usually lingers for 5-7 days and slowly improve. Some nausea, fatigue is normal but also improves.    Take acetaminophen or ibuprofen for associated pain. Ice your scalp. Ondansetron 4 mg for nausea, as needed.    Monitor for signs of concussion syndrome.   Concussion symptoms include include headache, nausea, sleep disturbance, dizziness, short term memory loss, vision disturbances, nausea, vomiting, light sensitivity.  Symptom may persist and slowly improve in 2-3 weeks, usually they resolve sooner in less than 1 week. These symptoms can wax and wane, and can be worsened by physical or cognitive exertion like reading, watching TV, studying, using computer, playing video games.   We recommend physical and cognitive rest for the next 48-72 hours. This means avoid any heavy exertional or cognitive (brain) activity during this time, that may worsen your symptoms, including reading, computer use, playing video games, watching TV, exercising, jumping, playing sports, high stress work environments.  You may return to work slowly after 48-72 hour rest period if your symptoms have resolved.  If your symptoms have not resolved after this 48-72 hour rest period, you will need to follow up with your primary care doctor or concussion or sports specialist for re-evaluation and to determine if you can return to work or if you need more tests or longer rest period.  The most important part of concussion management is avoiding a repeat head injury, do not perform any activities that will put you at risk for a second head injury. If you are an athlete, you must be cleared by a clinician before fully returning to sports. See attached concussion clinic/sports clinic contact  information for follow up as needed  Return to the emergency department if you develop mental status change, lethargy, vomiting, vision changes, confusion, severe or worsening headaches, seizures, weakness or numbness involving any part of your body.

## 2019-02-20 NOTE — ED Provider Notes (Addendum)
[facility]-EMERGENCY DEPT Provider Note   CSN: [id number] Arrival date & time: 02/20/19  0901     History Chief Complaint  Patient presents with  . Head Injury    Carl Shepard is a 33 y.o. male presents to the ER for evaluation of injury to the head.  This occurred last night.  He was getting ready to go visit his mother when he turned around quickly and hit his forehead on a nearby door.  He cannot tell me that time that the injury happened.  States he woke up on the ground but also cannot tell me what time he woke up on the ground.  He did not go visit his mother.  He got a ride here and did not drive.  Since injury he has felt tired, fatigued, nauseous.  He has an associated right frontal headache that is mild to moderate, gradual in onset.  Overall feels like things are getting better but his biggest concern is that he does not know how long he was out for.  He feels like his whole body is sore from falling asleep on the floor.  Denies any recent head injuries, possible concussion several years ago.  Denies any blood thinners, vision changes, double vision, vomiting, seizure, neck pain.  No other neurological symptoms.  Denies alcohol or drug use.  No interventions.  No alleviating factors.HPI     History reviewed. No pertinent past medical history.  There are no problems to display for this patient.   History reviewed. No pertinent surgical history.     History reviewed. No pertinent family history.  Social History   Tobacco Use  . Smoking status: Former Smoker    Packs/day: 0.50    Types: Cigarettes  . Smokeless tobacco: Never Used  Substance Use Topics  . Alcohol use: Yes  . Drug use: No    Home Medications Prior to Admission medications   Medication Sig Start Date End Date Taking? Authorizing Provider  ciprofloxacin-dexamethasone (CIPRODEX) otic suspension Place 4 drops into the left ear 2 (two) times daily. Take for 10 days 11/13/15    Dowless, Lester KinsmanSamantha Tripp, PA-C  HYDROcodone-acetaminophen (NORCO/VICODIN) 5-325 MG tablet Take 1 tablet by mouth every 6 (six) hours as needed. 12/12/17   Dartha LodgeFord, Kelsey N, PA-C  methocarbamol (ROBAXIN) 500 MG tablet Take 1 tablet (500 mg total) by mouth 2 (two) times daily. 12/12/17   Dartha LodgeFord, Kelsey N, PA-C  naproxen (NAPROSYN) 500 MG tablet Take 1 tablet (500 mg total) by mouth 2 (two) times daily. 12/12/17   Dartha LodgeFord, Kelsey N, PA-C  ondansetron (ZOFRAN ODT) 4 MG disintegrating tablet Take 1 tablet (4 mg total) by mouth every 8 (eight) hours as needed for nausea or vomiting. 02/20/19   Liberty HandyGibbons, Kalle Bernath J, PA-C  predniSONE (STERAPRED UNI-PAK 21 TAB) 10 MG (21) TBPK tablet Take by mouth daily. Take 6 tabs by mouth daily for 2 days, then 5 for 2 days, 4 for 2 days, 3 for 2 days, 2 for 2 days, 1 for 2 days 12/12/17   Dartha LodgeFord, Kelsey N, PA-C    Allergies    Patient has no known allergies.  Review of Systems   Review of Systems  Constitutional: Positive for fatigue.  Gastrointestinal: Positive for nausea.  Musculoskeletal: Positive for myalgias.  Neurological: Positive for headaches.  All other systems reviewed and are negative.   Physical Exam Updated Vital Signs BP (!) 152/77   Pulse 80   Temp 97.9 F (36.6 C) (Oral)  Resp 16   Wt 75.8 kg   SpO2 100%   Physical Exam Vitals and nursing note reviewed.  Constitutional:      General: He is not in acute distress.    Appearance: He is well-developed.     Comments: NAD.  HENT:     Head: Normocephalic.     Comments: 2 small linear vertical abrasions on the right forehead measuring approximately 2 cm and 3 cm each, mildly locally tender.  No surrounding contusion, hematoma.  No other facial or scalp or nasal bone tenderness.    Right Ear: External ear normal.     Left Ear: External ear normal.     Ears:     Comments: TMs visualized, no hemotympanum.  No battle signs.    Nose: Nose normal.     Comments: No nasal bone tenderness.  Intranasal  mucosa normal.  No epistaxis.  Septum midline, no septal hematoma.    Mouth/Throat:     Comments: Moist mucous membranes.  Dentition stable.  No intraoral or tongue injury.  Oropharynx and tonsils visualized, normal. Eyes:     General: No scleral icterus.    Conjunctiva/sclera: Conjunctivae normal.     Comments: PERRL and EOMs intact.  No periorbital bone tenderness.  No periorbital bruising.  Neck:     Comments: C-spine: No midline or paraspinal muscle tenderness.  Full range of motion of the neck without any pain. Cardiovascular:     Rate and Rhythm: Normal rate and regular rhythm.     Heart sounds: Normal heart sounds. No murmur.  Pulmonary:     Effort: Pulmonary effort is normal.     Breath sounds: Normal breath sounds. No wheezing.  Musculoskeletal:        General: No deformity. Normal range of motion.     Cervical back: Normal range of motion and neck supple.  Skin:    General: Skin is warm and dry.     Capillary Refill: Capillary refill takes less than 2 seconds.  Neurological:     Mental Status: He is alert and oriented to person, place, and time.     Comments:  Mental Status: Patient is awake, alert, oriented to person, place, year, and situation.  Patient is able to give a clear and coherent history. Speech is fluent and clear without dysarthria or aphasia. No signs of neglect.  Cranial Nerves: I not tested II visual fields full bilaterally. PERRL.  Unable to visualize posterior eye. III, IV, VI EOMs intact without ptosis or diplopia  V sensation to light touch intact in all 3 divisions of trigeminal nerve bilaterally  VII facial movements symmetric bilaterally VIII hearing intact to voice/conversation  IX, X no uvula deviation, symmetric rise of soft palate/uvula XI 5/5 SCM and trapezius strength bilaterally  XII tongue protrusion midline, symmetric L/R movements  Motor: Strength 5/5 in upper/lower extremities .   Sensation to light touch intact in face, upper/lower  extremities. No pronator drift. No leg drop.  Cerebellar: No ataxia with finger to nose. Steady gait/No truncal sway. Normal Romberg.   Psychiatric:        Behavior: Behavior normal.        Thought Content: Thought content normal.        Judgment: Judgment normal.     ED Results / Procedures / Treatments   Labs (all labs ordered are listed, but only abnormal results are displayed) Labs Reviewed - No data to display  EKG None  Radiology CT HEAD WO CONTRAST  Result  Date: 02/20/2019 CLINICAL DATA:  Syncope after head trauma yesterday. Headache and nausea. EXAM: CT HEAD WITHOUT CONTRAST TECHNIQUE: Contiguous axial images were obtained from the base of the skull through the vertex without intravenous contrast. COMPARISON:  None. FINDINGS: Brain: No evidence of acute infarction, hemorrhage, hydrocephalus, extra-axial collection or mass lesion/mass effect. Vascular: No hyperdense vessel or unexpected calcification. Skull: Normal. Negative for fracture or focal lesion. Sinuses/Orbits: Normal. Other: None IMPRESSION: Normal exam. Electronically Signed   By: Lorriane Shire M.D.   On: 02/20/2019 10:10    Procedures Procedures (including critical care time)  Medications Ordered in ED Medications - No data to display  ED Course  I have reviewed the triage vital signs and the nursing notes.  Pertinent labs & imaging results that were available during my care of the patient were reviewed by me and considered in my medical decision making (see chart for details).    MDM Rules/Calculators/A&P                      33 year old healthy male here with headache, nausea, generalized body soreness, fatigue after head injury.  Low risk mechanism of injury.  He reports loss of consciousness for unknown time.  He cannot give me an approximate time of when the injury happened or what time he woke up on the floor.  Exam is benign as above, small abrasions on the forehead.  No neuro deficits.  No  associated red flag symptoms of head injury like diminished GCS, high risk mechanism of injury, severe headache, vomiting, seizure, focal neuro deficits.  No anticoagulant use.  Normal neuro status.  CT head ordered in triage personally reviewed and nonacute.  Event happened more than 12 hours ago and patient has had improvement of symptoms overall and reassuring exam.  I do not think further emergent ER work-up is indicated.  Presentation most consistent with posttraumatic headache, mild concussion/TBI. Concussion/mild TBI anticipatory guidance discussed with and given to patient.  Also discussed post concussive syndrome, symptoms that would warrant immediate return to the ED for re-evaluation.  Recommended 48-72 hr cognitive and physical rest period, and slow return to work if symptoms have resolved.  F/u with PCP/concussion clinic in 48 hours for re-evaluation if symptoms are lingering, formal RTP/sports clearance.  Patient verbalized understanding and comfortable with plan.   Final Clinical Impression(s) / ED Diagnoses Final diagnoses:  Injury of head, initial encounter  Abrasion of head, initial encounter  Acute post-traumatic headache, not intractable    Rx / DC Orders ED Discharge Orders         Ordered    ondansetron (ZOFRAN ODT) 4 MG disintegrating tablet  Every 8 hours PRN     02/20/19 1145             Kinnie Feil, [place] 02/20/19 [place], [place], DO 02/20/19 1241

## 2019-02-20 NOTE — ED Notes (Signed)
Patient left ED. Stated his ride is outside and if he doesn't leave now then he won't have a ride home,. Patient stated he and provider agreed on discharge. Patient stated his mother (whom works here) will pick up his discharge paperwork later.

## 2019-02-20 NOTE — ED Triage Notes (Signed)
Pt states that he was cleaning up yesterday and accidentally hit head on door. Pt states that he woke up on the floor. Pt states that he was out for "hours". Pt c/o continued headache. Pt endorses nausea as well.

## 2021-08-17 IMAGING — CT CT HEAD W/O CM
3 series · 14 of 47 positions shown, 16 images · non-contrast
Comparison: None.

CLINICAL DATA: Syncope after head trauma yesterday. Headache and
nausea.

EXAM:
CT HEAD WITHOUT CONTRAST
TECHNIQUE: Contiguous axial images were obtained from the base of the skull
through the vertex without intravenous contrast.

[Series 2: head wo · axial · 0.52mm/px · z∈[-144,+1]mm · 8 of 35 slices shown, 10 images]
[im 3/35  brain]
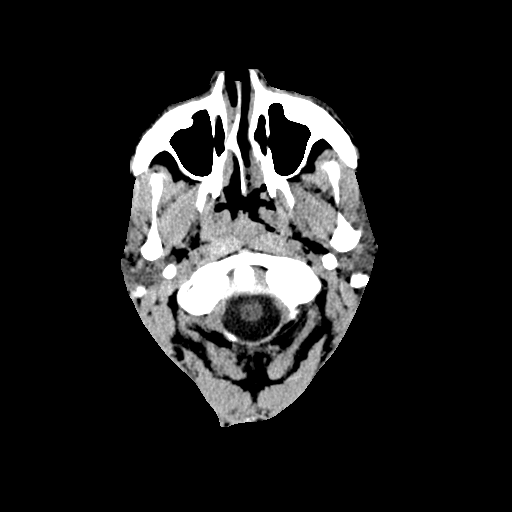
[im 3/35  bone]
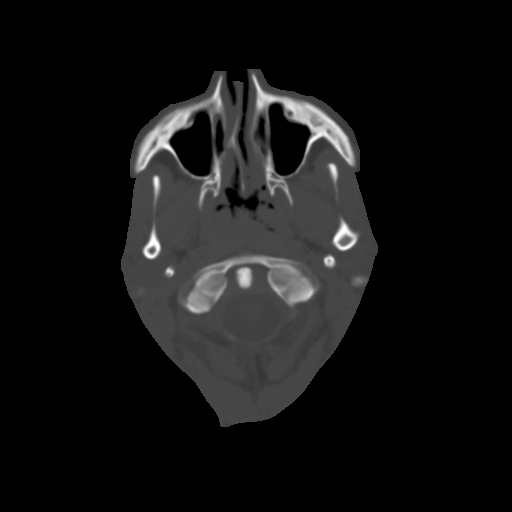
[im 8/35  brain]
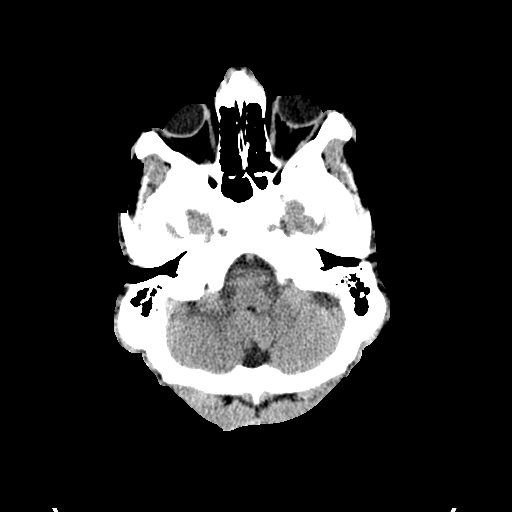
[im 11/35  brain]
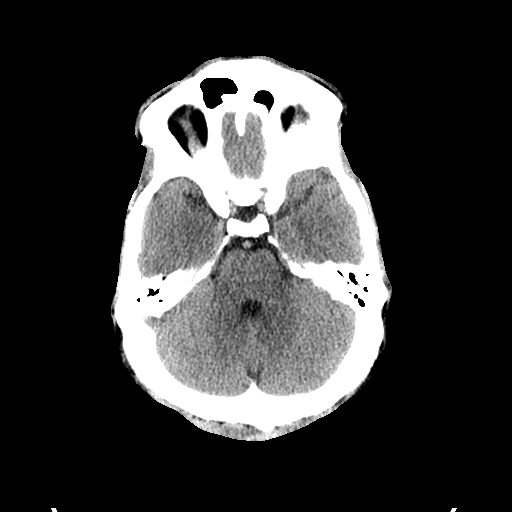
[im 16/35  brain]
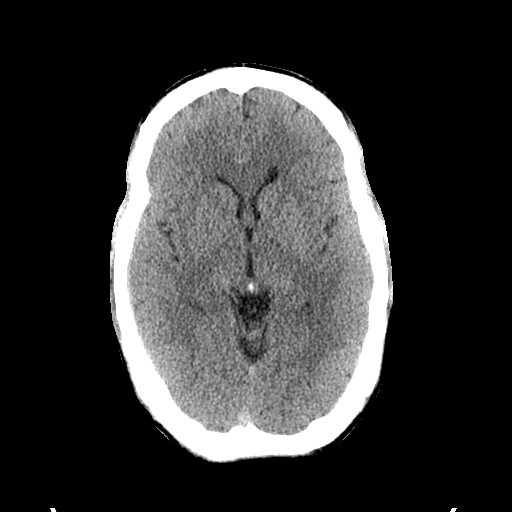
[im 19/35  brain]
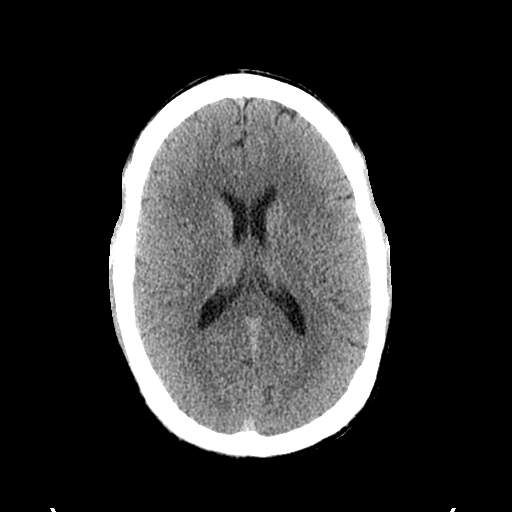
[im 19/35  bone]
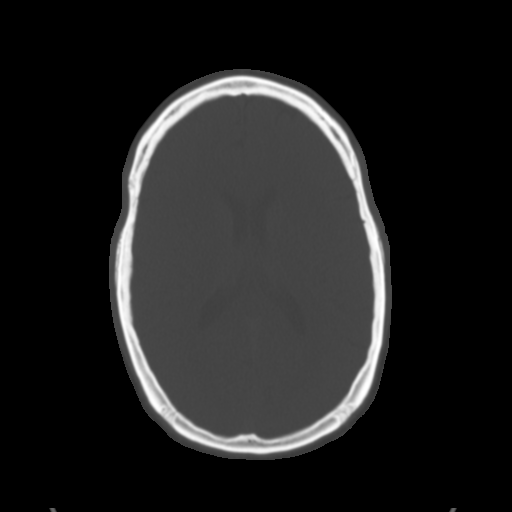
[im 24/35  brain]
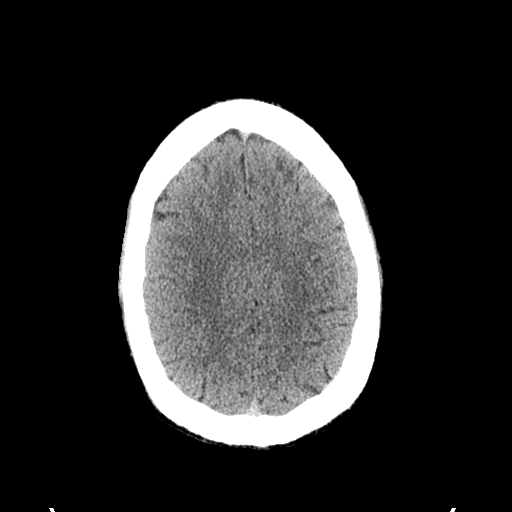
[im 27/35  brain]
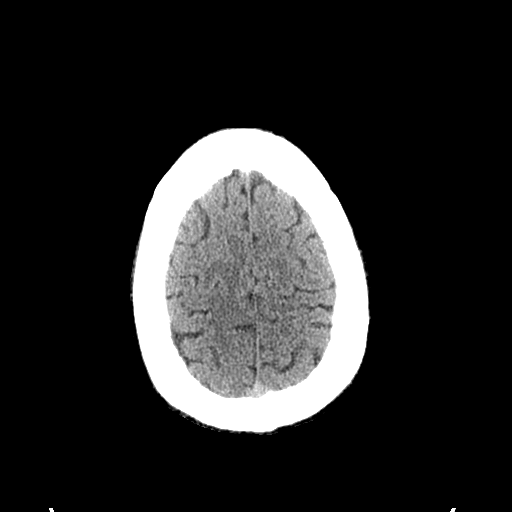
[im 32/35  brain]
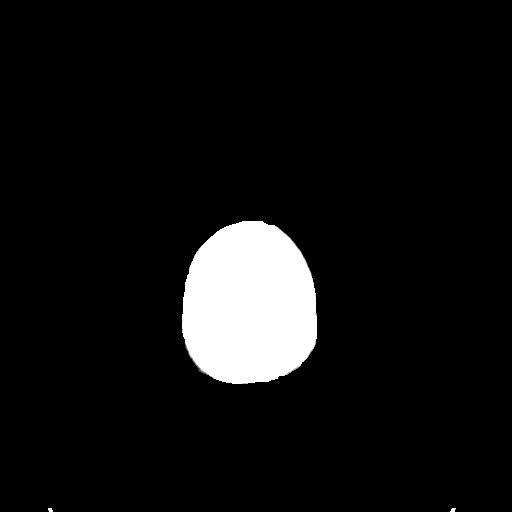

[Series 4: coronal soft tissue · coronal · 0.36mm/px · 3 of 69 slices shown]
[im 23/69  brain]
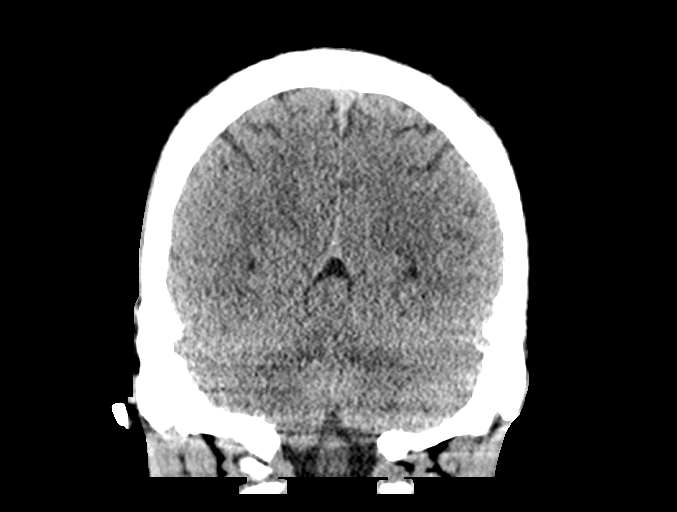
[im 31/69  brain]
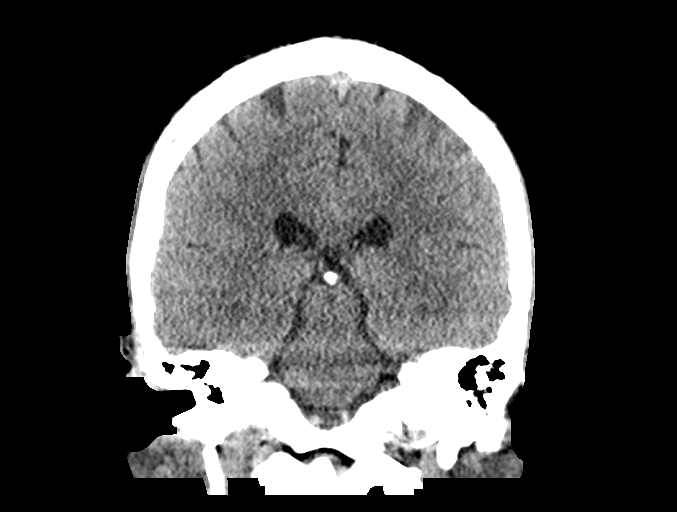
[im 38/69  brain]
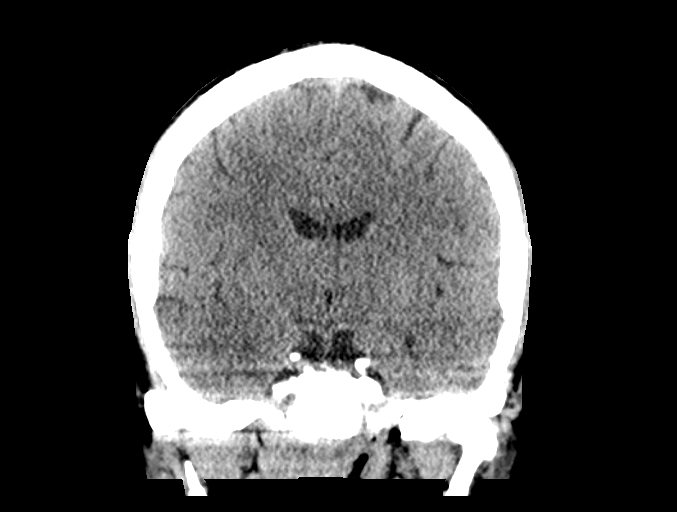

[Series 5: sagittal soft tissue · sagittal · 0.36mm/px · 3 of 51 slices shown]
[im 17/51  brain]
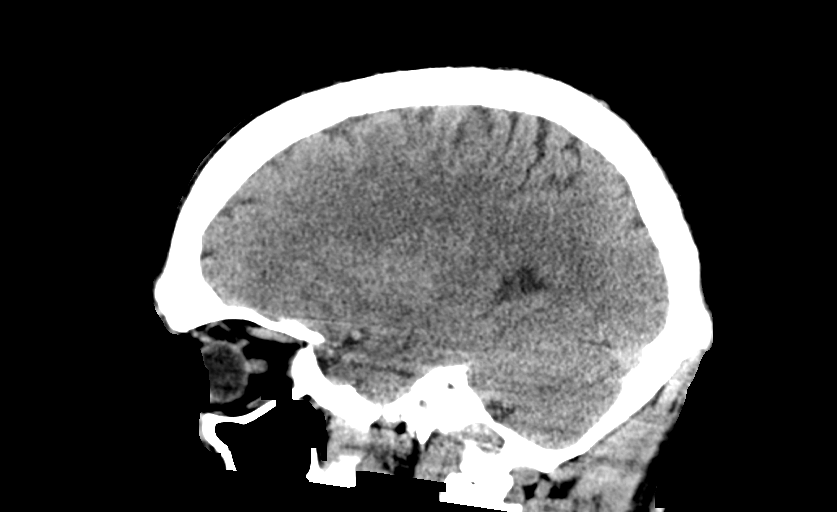
[im 26/51  brain]
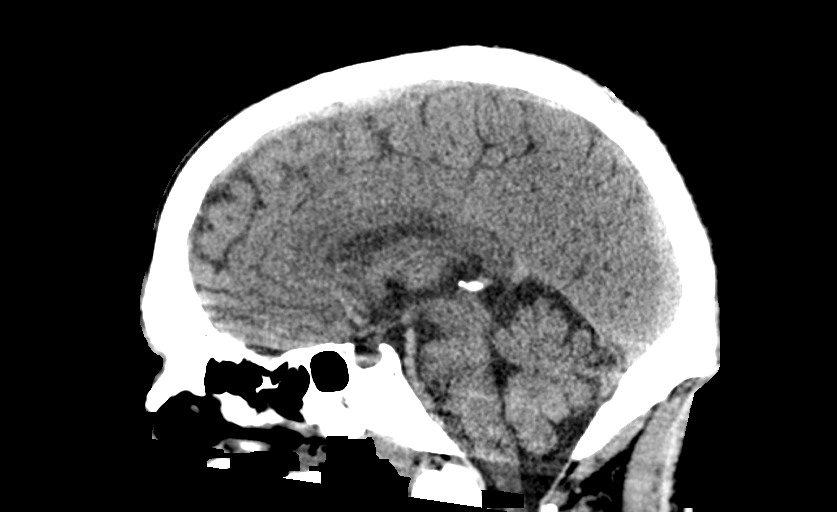
[im 34/51  brain]
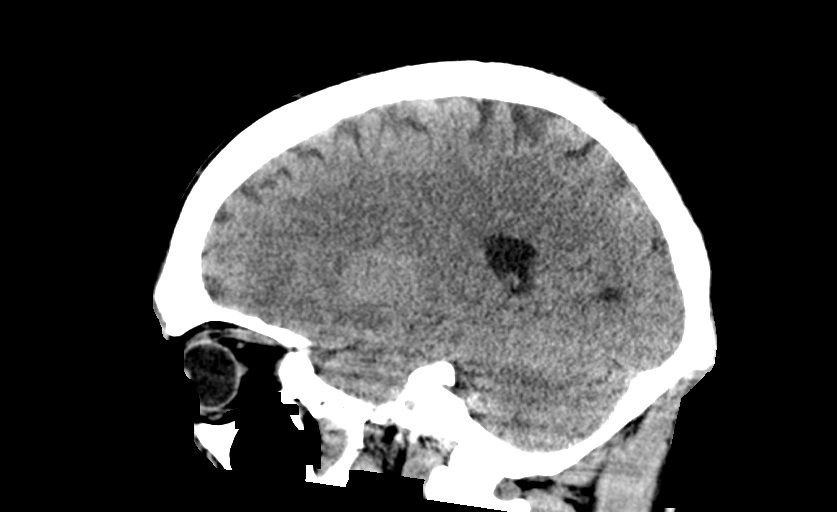

[14 of 47 positions shown; findings below may reference images not displayed]

FINDINGS: Brain: No evidence of acute infarction, hemorrhage, hydrocephalus,
extra-axial collection or mass lesion/mass effect.

Vascular: No hyperdense vessel or unexpected calcification.

Skull: Normal. Negative for fracture or focal lesion.

Sinuses/Orbits: Normal.

Other: None
IMPRESSION: Normal exam.
